# Patient Record
Sex: Male | Born: 2000 | Race: Black or African American | Hispanic: No | Marital: Single | Smoking: Current every day smoker
Health system: Southern US, Community
[De-identification: ages and names within clinical notes are randomized; demographics above are authoritative.]

---
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
## 2023-07-01 ENCOUNTER — Encounter (HOSPITAL_COMMUNITY): Payer: Self-pay | Admitting: Emergency Medicine

## 2023-07-01 ENCOUNTER — Other Ambulatory Visit: Payer: Self-pay

## 2023-07-01 ENCOUNTER — Emergency Department (HOSPITAL_COMMUNITY)
Admission: EM | Admit: 2023-07-01 | Discharge: 2023-07-01 | Disposition: A | Discharge status: Home / Self Care | Payer: Self-pay | Attending: Emergency Medicine | Admitting: Emergency Medicine

## 2023-07-01 ENCOUNTER — Emergency Department (HOSPITAL_COMMUNITY): Payer: Self-pay

## 2023-07-01 DIAGNOSIS — Z20822 Contact with and (suspected) exposure to covid-19: Secondary | ICD-10-CM | POA: Insufficient documentation

## 2023-07-01 DIAGNOSIS — J101 Influenza due to other identified influenza virus with other respiratory manifestations: Secondary | ICD-10-CM | POA: Insufficient documentation

## 2023-07-01 DIAGNOSIS — J45901 Unspecified asthma with (acute) exacerbation: Secondary | ICD-10-CM | POA: Insufficient documentation

## 2023-07-01 LAB — BASIC METABOLIC PANEL
Anion gap: 9 (ref 5–15)
BUN: 14 mg/dL (ref 6–20)
CO2: 21 mmol/L — ABNORMAL LOW (ref 22–32)
Calcium: 9.1 mg/dL (ref 8.9–10.3)
Chloride: 107 mmol/L (ref 98–111)
Creatinine, Ser: 1.07 mg/dL (ref 0.61–1.24)
GFR, Estimated: >60 mL/min (ref >60)
Glucose, Bld: 112 mg/dL — ABNORMAL HIGH (ref 70–99)
Potassium: 3.6 mmol/L (ref 3.5–5.1)
Sodium: 137 mmol/L (ref 135–145)

## 2023-07-01 LAB — CBC WITH DIFFERENTIAL/PLATELET
Abs Immature Granulocytes: 0.01 10*3/uL (ref 0.00–0.07)
Basophils Absolute: 0.1 10*3/uL (ref 0.0–0.1)
Basophils Relative: 1 %
Eosinophils Absolute: 0 10*3/uL (ref 0.0–0.5)
Eosinophils Relative: 0 %
HCT: 40.9 % (ref 39.0–52.0)
Hemoglobin: 13.8 g/dL (ref 13.0–17.0)
Immature Granulocytes: 0 %
Lymphocytes Relative: 5 %
Lymphs Abs: 0.4 10*3/uL — ABNORMAL LOW (ref 0.7–4.0)
MCH: 30.9 pg (ref 26.0–34.0)
MCHC: 33.7 g/dL (ref 30.0–36.0)
MCV: 91.7 fL (ref 80.0–100.0)
Monocytes Absolute: 0.5 10*3/uL (ref 0.1–1.0)
Monocytes Relative: 7 %
Neutro Abs: 6.3 10*3/uL (ref 1.7–7.7)
Neutrophils Relative %: 87 %
Platelets: 275 10*3/uL (ref 150–400)
RBC: 4.46 MIL/uL (ref 4.22–5.81)
RDW: 12.1 % (ref 11.5–15.5)
WBC: 7.2 10*3/uL (ref 4.0–10.5)
nRBC: 0 % (ref 0.0–0.2)

## 2023-07-01 LAB — RESP PANEL BY RT-PCR (RSV, FLU A&B, COVID)  RVPGX2
Influenza A by PCR: POSITIVE — AB
Influenza B by PCR: NEGATIVE
Resp Syncytial Virus by PCR: NEGATIVE
SARS Coronavirus 2 by RT PCR: NEGATIVE

## 2023-07-01 MED ORDER — OSELTAMIVIR PHOSPHATE 75 MG PO CAPS: 75.0000 mg | ORAL_CAPSULE | Freq: Two times a day (BID) | ORAL | 0 refills | Status: DC

## 2023-07-01 MED ORDER — PREDNISONE 20 MG PO TABS: 40.0000 mg | ORAL_TABLET | Freq: Every day | ORAL | 0 refills | Status: DC

## 2023-07-01 MED ORDER — IPRATROPIUM-ALBUTEROL 0.5-2.5 (3) MG/3ML IN SOLN
3.0000 mL | Freq: Once | RESPIRATORY_TRACT | Status: AC
  Administered 2023-07-01: 3 mL via RESPIRATORY_TRACT

## 2023-07-01 MED ORDER — ACETAMINOPHEN 500 MG PO TABS
1000.0000 mg | ORAL_TABLET | Freq: Once | ORAL | Status: AC
  Administered 2023-07-01: 1000 mg via ORAL

## 2023-07-01 MED ORDER — ALBUTEROL SULFATE HFA 108 (90 BASE) MCG/ACT IN AERS
2.0000 | INHALATION_SPRAY | Freq: Once | RESPIRATORY_TRACT | Status: AC
  Administered 2023-07-01: 2 via RESPIRATORY_TRACT
  Filled 2023-07-01: qty 6.7

## 2023-07-01 MED ORDER — METHYLPREDNISOLONE SODIUM SUCC 125 MG IJ SOLR
125.0000 mg | Freq: Once | INTRAMUSCULAR | Status: AC
  Administered 2023-07-01: 125 mg via INTRAVENOUS

## 2023-07-01 MED ORDER — IPRATROPIUM-ALBUTEROL 0.5-2.5 (3) MG/3ML IN SOLN: 3.0000 mL | Freq: Once | RESPIRATORY_TRACT | Status: DC

## 2023-07-01 NOTE — ED Triage Notes (Signed)
 Pt. States he was woken up in the middle of the night with congestion in his chest, headache, and joint pain. No fever. Denies N/V/D.

## 2023-07-01 NOTE — ED Provider Notes (Signed)
 [place] EMERGENCY DEPARTMENT AT [facility] Provider Note   CSN: [id number] Arrival date & time: 07/01/23  1324     History  Chief Complaint  Patient presents with   Shortness of Breath   Headache   Joint Pain    Brandon Bentley is a 23 y.o. male with history of asthma who presents the emergency department complaining of nasal congestion, rhinorrhea, headache, body aches starting last night.  Also reporting wheezing and shortness of breath.  Does not currently have a rescue inhaler.   Shortness of Breath Associated symptoms: cough, headaches and wheezing   Headache Associated symptoms: congestion, cough and myalgias        Home Medications Prior to Admission medications   Medication Sig Start Date End Date Taking? Authorizing Provider  oseltamivir  (TAMIFLU ) 75 MG capsule Take 1 capsule (75 mg total) by mouth every 12 (twelve) hours. 07/01/23  Yes Jayna Mulnix T, PA-C  predniSONE  (DELTASONE ) 20 MG tablet Take 2 tablets (40 mg total) by mouth daily with breakfast for 5 days. 07/01/23 07/06/23 Yes Aideliz Garmany T, PA-C      Allergies    Patient has no known allergies.    Review of Systems   Review of Systems  HENT:  Positive for congestion and rhinorrhea.   Respiratory:  Positive for cough, shortness of breath and wheezing.   Musculoskeletal:  Positive for myalgias.  Neurological:  Positive for headaches.  All other systems reviewed and are negative.   Physical Exam Updated Vital Signs BP (!) 143/74   Pulse 85   Temp 98.2 F (36.8 C) (Oral)   Resp 18   Ht 5' 10 (1.778 m)   Wt 65.8 kg   SpO2 96%   BMI 20.81 kg/m  Physical Exam Vitals and nursing note reviewed.  Constitutional:      Appearance: Normal appearance.  HENT:     Head: Normocephalic and atraumatic.  Eyes:     Conjunctiva/sclera: Conjunctivae normal.  Cardiovascular:     Rate and Rhythm: Normal rate and regular rhythm.  Pulmonary:     Effort: Pulmonary effort is normal. No  respiratory distress.     Breath sounds: No decreased air movement. Wheezing present.     Comments: Expiratory wheezing in all lung fields.  Normal air movement to the bases.   Abdominal:     General: There is no distension.     Palpations: Abdomen is soft.     Tenderness: There is no abdominal tenderness.  Skin:    General: Skin is warm and dry.  Neurological:     General: No focal deficit present.     Mental Status: He is alert.     ED Results / Procedures / Treatments   Labs (all labs ordered are listed, but only abnormal results are displayed) Labs Reviewed  RESP PANEL BY RT-PCR (RSV, FLU A&B, COVID)  RVPGX2 - Abnormal; Notable for the following components:      Result Value   Influenza A by PCR POSITIVE (*)    All other components within normal limits  CBC WITH DIFFERENTIAL/PLATELET - Abnormal; Notable for the following components:   Lymphs Abs 0.4 (*)    All other components within normal limits  BASIC METABOLIC PANEL - Abnormal; Notable for the following components:   CO2 21 (*)    Glucose, Bld 112 (*)    All other components within normal limits    EKG None  Radiology DG Chest 2 View Result Date: 07/01/2023 CLINICAL DATA:  Shortness of breath.  Chest congestion. EXAM: CHEST - 2 VIEW COMPARISON:  Chest radiographs 07/26/2016, 04/09/2006 FINDINGS: Cardiac silhouette and mediastinal contours are within normal limits. The lungs are clear. No pleural effusion or pneumothorax. No acute skeletal abnormality. Minimal dextrocurvature of the mid to lower thoracic spine is unchanged from prior. IMPRESSION: No active cardiopulmonary disease. Electronically Signed   By: Tanda Lyons M.D.   On: 07/01/2023 15:54    Procedures Procedures    Medications Ordered in ED Medications  ipratropium-albuterol  (DUONEB) 0.5-2.5 (3) MG/3ML nebulizer solution 3 mL (3 mLs Nebulization Given 07/01/23 1405)  ipratropium-albuterol  (DUONEB) 0.5-2.5 (3) MG/3ML nebulizer solution 3 mL (3 mLs  Nebulization Given 07/01/23 1405)  acetaminophen  (TYLENOL ) tablet 1,000 mg (1,000 mg Oral Given 07/01/23 1405)  methylPREDNISolone  sodium succinate (SOLU-MEDROL ) 125 mg/2 mL injection 125 mg (125 mg Intravenous Given 07/01/23 1612)  albuterol  (VENTOLIN  HFA) 108 (90 Base) MCG/ACT inhaler 2 puff (2 puffs Inhalation Given 07/01/23 1611)    ED Course/ Medical Decision Making/ A&P                                 Medical Decision Making Risk Prescription drug management.  This patient is a 23 y.o. male who presents to the ED for concern of flu like symptoms, shortness of breath with hx of asthma.   Differential diagnoses prior to evaluation: The emergent differential diagnosis includes, but is not limited to,  upper respiratory infection, lower respiratory infection, allergies, asthma, irritants, sinus/esophageal foreign body, medications, reflux, interstitial lung disease, postnasal drip, viral illness, sepsis. This is not an exhaustive differential.   Past Medical History / Co-morbidities / Additional history: Chart reviewed. Pertinent results include: asthma  Physical Exam: Physical exam performed. The pertinent findings include: Mildly hypertensive, otherwise normal vital signs.  No acute distress.  Initially tachycardic in triage, but resolved upon my exam.  Mild expiratory wheezing in all lung fields, with good air movement to the bases.  Normal oxygen saturation on room air.  Resting comfortably in exam bed.  Lab Tests/Imaging studies: I personally interpreted labs/imaging and the pertinent results include:  respiratory panel positive for influenza A.  CBC and BMP unremarkable.    Chest x-ray with no acute cardiopulmonary abnormalities.  I agree with the radiologist interpretation.  Cardiac monitoring: EKG obtained and interpreted by my attending physician which shows: sinus rhythm   Medications: Duoneb and tylenol  ordered from triage. I ordered medication including solumedrol, albuterol   inhaler.  I have reviewed the patients home medicines and have made adjustments as needed.   Disposition: After consideration of the diagnostic results and the patients response to treatment, I feel that emergency department workup does not suggest an emergent condition requiring admission or immediate intervention beyond what has been performed at this time. Patient with symptoms consistent with influenza.  Vitals are stable, No signs of dehydration, tolerating PO's.  Lungs are clear.   The plan is: Patient will be discharged with instructions to orally hydrate, rest, and use over-the-counter medications such as anti-inflammatories such as ibuprofen  and Tylenol  for fever.   Prescribed tamiflu  and prednisone  burst. Given albuterol  inhaler to take home. The patient is safe for discharge and has been instructed to return immediately for worsening symptoms, change in symptoms or any other concerns.  Final Clinical Impression(s) / ED Diagnoses Final diagnoses:  Influenza A  Mild asthma exacerbation    Rx / DC Orders ED Discharge Orders  Ordered    predniSONE  (DELTASONE ) 20 MG tablet  Daily with breakfast        07/01/23 1644    oseltamivir  (TAMIFLU ) 75 MG capsule  Every 12 hours        07/01/23 1644           Portions of this report may have been transcribed using voice recognition software. Every effort was made to ensure accuracy; however, inadvertent computerized transcription errors may be present.    Corie Friddie ONEIDA DEVONNA 07/01/23 1645    Yolande Lamar BROCKS, MD 07/02/23 [phone]

## 2023-07-01 NOTE — ED Provider Triage Note (Signed)
 Emergency Medicine Provider Triage Evaluation Note  Brandon Bentley , a 23 y.o. male  was evaluated in triage.  Pt complains of URI and SOB/asthma.  Review of Systems  Positive:  Negative:   Physical Exam  BP (!) 149/75   Pulse (!) 113   Temp 98.2 F (36.8 C) (Oral)   Resp 18   Ht 5' 10 (1.778 m)   Wt 65.8 kg   SpO2 100%   BMI 20.81 kg/m  Gen:   Awake, no distress   Resp:  Normal effort  MSK:   Moves extremities without difficulty  Other:    Medical Decision Making  Medically screening exam initiated at 1:54 PM.  Appropriate orders placed.  Brandon Bentley was informed that the remainder of the evaluation will be completed by another provider, this initial triage assessment does not replace that evaluation, and the importance of remaining in the ED until their evaluation is complete.  Congestion, rhinorrhea, headache, body aches since last night. Hx of asthma and feeling SOB. Does not have rescue inhaler - states that he needs a new one.    Brandon Bentley [place], [place] 07/01/23 1356

## 2023-07-01 NOTE — Discharge Instructions (Addendum)
 You were seen in the emergency department today for shortness of breath, body aches, etc.  As we discussed your influenza A test is positive.  This is a viral illness very common at this time of year, and we normally treat with over-the-counter medications.  Symptoms can last for up to a week.  You can take ibuprofen  or Tylenol  for pain or fever, and I recommend alternating between the 2.  Make sure that you are drinking lots of fluids and getting plenty of rest. You can take decongestants as long as you take them with lots of water. You can use lozenges or chloraseptic spray as needed for sore throat.   Please use Tylenol  or ibuprofen  for pain.  You may use 600 mg ibuprofen  every 6 hours or 1000 mg of Tylenol  every 6 hours.  You may choose to alternate between the 2.  This would be most effective.  Do not exceed 4 g of Tylenol  within 24 hours.  Do not exceed 3200 mg ibuprofen  within 24 hours.  I've given you an inhaler to take home with you, and I have also prescribed you the antiviral medicine for the flu, called Tamiflu , and a short course of steroids.   Continue to monitor how you are doing, and return to the emergency department for new or worsening symptoms such as chest pain, difficulty breathing not related to coughing, fever despite medication, or persistent vomiting or diarrhea.  It has been a pleasure taking care of you today and I hope you begin to feel better soon!

## 2023-07-02 ENCOUNTER — Telehealth (HOSPITAL_COMMUNITY): Payer: Self-pay | Admitting: Emergency Medicine

## 2023-07-02 MED ORDER — OSELTAMIVIR PHOSPHATE 75 MG PO CAPS
75.0000 mg | ORAL_CAPSULE | Freq: Two times a day (BID) | ORAL | 0 refills | Status: DC
Start: 2023-07-02 — End: 2023-07-03

## 2023-07-02 MED ORDER — PREDNISONE 20 MG PO TABS: 40.0000 mg | ORAL_TABLET | Freq: Every day | ORAL | 0 refills | Status: DC

## 2023-07-02 NOTE — Telephone Encounter (Cosign Needed)
 Pt requested medications from yesterday's visit be changed to [facility] on [facility].

## 2023-07-03 ENCOUNTER — Other Ambulatory Visit (HOSPITAL_COMMUNITY): Payer: Self-pay | Admitting: Emergency Medicine

## 2023-07-03 MED ORDER — PREDNISONE 20 MG PO TABS: 40.0000 mg | ORAL_TABLET | Freq: Every day | ORAL | 0 refills | Status: AC

## 2023-07-03 MED ORDER — OSELTAMIVIR PHOSPHATE 75 MG PO CAPS: 75.0000 mg | ORAL_CAPSULE | Freq: Two times a day (BID) | ORAL | 0 refills | Status: AC

## 2024-02-13 ENCOUNTER — Encounter (HOSPITAL_COMMUNITY): Payer: Self-pay

## 2024-02-13 ENCOUNTER — Other Ambulatory Visit: Payer: Self-pay

## 2024-02-13 ENCOUNTER — Emergency Department (HOSPITAL_COMMUNITY)
Admission: EM | Admit: 2024-02-13 | Discharge: 2024-02-13 | Disposition: A | Discharge status: Home / Self Care | Payer: Self-pay | Attending: Emergency Medicine | Admitting: Emergency Medicine

## 2024-02-13 DIAGNOSIS — J45909 Unspecified asthma, uncomplicated: Secondary | ICD-10-CM | POA: Insufficient documentation

## 2024-02-13 DIAGNOSIS — J039 Acute tonsillitis, unspecified: Secondary | ICD-10-CM | POA: Insufficient documentation

## 2024-02-13 LAB — GROUP A STREP BY PCR: Group A Strep by PCR: NOT DETECTED

## 2024-02-13 LAB — RESP PANEL BY RT-PCR (RSV, FLU A&B, COVID)  RVPGX2
Influenza A by PCR: NEGATIVE
Influenza B by PCR: NEGATIVE
Resp Syncytial Virus by PCR: NEGATIVE
SARS Coronavirus 2 by RT PCR: NEGATIVE

## 2024-02-13 MED ORDER — AMOXICILLIN-POT CLAVULANATE 875-125 MG PO TABS: 1.0000 | ORAL_TABLET | Freq: Two times a day (BID) | ORAL | 0 refills | Status: AC

## 2024-02-13 MED ORDER — AMOXICILLIN-POT CLAVULANATE 875-125 MG PO TABS
1.0000 | ORAL_TABLET | Freq: Once | ORAL | Status: AC
  Administered 2024-02-13: 1 via ORAL
  Filled 2024-02-13: qty 1

## 2024-02-13 MED ORDER — DEXAMETHASONE SODIUM PHOSPHATE 10 MG/ML IJ SOLN
10.0000 mg | Freq: Once | INTRAMUSCULAR | Status: AC
  Administered 2024-02-13: 10 mg via INTRAVENOUS
  Filled 2024-02-13: qty 1

## 2024-02-13 MED ORDER — HYDROCODONE-ACETAMINOPHEN 7.5-325 MG/15ML PO SOLN
10.0000 mL | Freq: Once | ORAL | Status: AC
  Administered 2024-02-13: 10 mL via ORAL
  Filled 2024-02-13: qty 15

## 2024-02-13 NOTE — Discharge Instructions (Addendum)
 You were seen today for sore throat.  Your COVID, flu, and strep testing are negative.  You will be treated for bacterial tonsillitis given your physical exam findings.  Follow-up with ENT.  If you develop fevers, worsening pain, difficulty swallowing, any new or worsening symptoms, you should be reevaluated.  Take Augmentin  as prescribed and take ibuprofen  for pain.  Make sure that you are drinking plenty of fluids.

## 2024-02-13 NOTE — ED Triage Notes (Signed)
 Presents via POV c/o sore throat since last Thursday.

## 2024-02-13 NOTE — ED Provider Notes (Signed)
 [place] [facility] AT [facility] Provider Note   CSN: [id number] Arrival date & time: 02/13/24  0000     Patient presents with: Sore Throat   Brandon Bentley is a 23 y.o. male.   HPI     This is a 23 year old male who presents with sore throat.  Reports onset of sore throat on Thursday.  He has not had any fevers but does endorse some chills.  He has had some runny nose but no cough.  States he is having difficulty swallowing.  He has not taken anything for his symptoms.  Prior to Admission medications   Medication Sig Start Date End Date Taking? Authorizing Provider  amoxicillin -clavulanate (AUGMENTIN ) 875-125 MG tablet Take 1 tablet by mouth every 12 (twelve) hours. 02/13/24  Yes Vishaal Strollo, Charmaine FALCON, MD  oseltamivir  (TAMIFLU ) 75 MG capsule Take 1 capsule (75 mg total) by mouth every 12 (twelve) hours. 07/03/23   Waddell Sluder, PA-C    Allergies: Patient has no known allergies.    Review of Systems  Constitutional:  Positive for chills. Negative for fever.  HENT:  Positive for rhinorrhea, sore throat and trouble swallowing.   Respiratory:  Negative for shortness of breath.   Cardiovascular:  Negative for chest pain.  All other systems reviewed and are negative.   Updated Vital Signs BP 117/84   Pulse 83   Temp 98.2 F (36.8 C) (Oral)   Resp 17   SpO2 100%   Physical Exam Vitals and nursing note reviewed.  Constitutional:      Appearance: He is well-developed. He is not ill-appearing.  HENT:     Head: Normocephalic and atraumatic.     Mouth/Throat:     Tonsils: 2+ on the right. 2+ on the left.     Comments: Posterior oropharynx erythematous, bilateral tonsillar enlargement with tonsillar exudate noted, uvula midline Eyes:     Pupils: Pupils are equal, round, and reactive to light.  Cardiovascular:     Rate and Rhythm: Normal rate and regular rhythm.     Heart sounds: Normal heart sounds. No murmur heard. Pulmonary:     Effort: Pulmonary  effort is normal. No respiratory distress.     Breath sounds: Normal breath sounds. No wheezing.  Abdominal:     General: Bowel sounds are normal.     Palpations: Abdomen is soft.     Tenderness: There is no abdominal tenderness. There is no rebound.  Musculoskeletal:     Cervical back: Neck supple.  Lymphadenopathy:     Cervical: Cervical adenopathy present.  Skin:    General: Skin is warm and dry.  Neurological:     Mental Status: He is alert and oriented to person, place, and time.  Psychiatric:        Mood and Affect: Mood normal.     (all labs ordered are listed, but only abnormal results are displayed) Labs Reviewed  GROUP A STREP BY PCR  RESP PANEL BY RT-PCR (RSV, FLU A&B, COVID)  [id number]    EKG: None  Radiology: No results found.   Procedures   Medications Ordered in the ED  dexamethasone  (DECADRON ) injection 10 mg (10 mg Intravenous Given 02/13/24 0046)  HYDROcodone -acetaminophen  (HYCET) 7.5-325 mg/15 ml solution 10 mL (10 mLs Oral Given 02/13/24 0045)  amoxicillin -clavulanate (AUGMENTIN ) 875-125 MG per tablet 1 tablet (1 tablet Oral Given 02/13/24 0146)  Medical Decision Making Risk Prescription drug management.   This patient presents to the ED for concern of sore throat, this involves an extensive number of treatment options, and is a complaint that carries with it a high risk of complications and morbidity.  I considered the following differential and admission for this acute, potentially life threatening condition.  The differential diagnosis includes strep throat, COVID, flu, viral pharyngitis, tonsillitis, deep space infection such as peritonsillar abscess  MDM:    This is a 23 year old male who presents with sore throat.  He is nontoxic.  Vital signs are reassuring.  He is afebrile.  Airway is intact.  He does have bilateral symmetric tonsillar swelling and tonsillar exudate.  He has cervical adenopathy.  Low suspicion  for peritonsillar abscess at this time.  Will defer imaging.  COVID, flu, strep are all negative.  Given the extent of swelling and exudate, will treat for bacterial tonsillitis with Augmentin .  Patient was able to tolerate p.o. We discussed hydration and pain management.  Patient was given strict return precautions.  (Labs, imaging, consults)  Labs: I Ordered, and personally interpreted labs.  The pertinent results include: COVID, flu, and strep  Imaging Studies ordered: I ordered imaging studies including none I independently visualized and interpreted imaging. I agree with the radiologist interpretation  Additional history obtained from chart review.  External records from outside source obtained and reviewed including prior evaluations  Cardiac Monitoring: The patient was maintained on a cardiac monitor.  If on the cardiac monitor, I personally viewed and interpreted the cardiac monitored which showed an underlying rhythm of: Sinus  Reevaluation: After the interventions noted above, I reevaluated the patient and found that they have :improved  Social Determinants of Health:  lives independently  Disposition: Discharge  Co morbidities that complicate the patient evaluation  Past Medical History:  Diagnosis Date   Asthma      Medicines Meds ordered this encounter  Medications   dexamethasone  (DECADRON ) injection 10 mg   HYDROcodone -acetaminophen  (HYCET) 7.5-325 mg/15 ml solution 10 mL    Refill:  0   amoxicillin -clavulanate (AUGMENTIN ) 875-125 MG per tablet 1 tablet   amoxicillin -clavulanate (AUGMENTIN ) 875-125 MG tablet    Sig: Take 1 tablet by mouth every 12 (twelve) hours.    Dispense:  14 tablet    Refill:  0    I have reviewed the patients home medicines and have made adjustments as needed  Problem List / ED Course: Problem List Items Addressed This Visit   None Visit Diagnoses       Tonsillitis    -  Primary                Final diagnoses:   Tonsillitis    ED Discharge Orders          Ordered    amoxicillin -clavulanate (AUGMENTIN ) 875-125 MG tablet  Every 12 hours        02/13/24 0243               Kelseigh Diver, Charmaine FALCON, MD 02/13/24 [phone]
